# Patient Record
Sex: Male | Born: 1995 | Race: White | Hispanic: No | Marital: Single | Smoking: Never smoker
Health system: Southern US, Community
[De-identification: ages and names within clinical notes are randomized; demographics above are authoritative.]

---

## 2019-05-22 ENCOUNTER — Emergency Department (HOSPITAL_COMMUNITY): Payer: PRIVATE HEALTH INSURANCE

## 2019-05-22 ENCOUNTER — Emergency Department (HOSPITAL_COMMUNITY)
Admission: EM | Admit: 2019-05-22 | Discharge: 2019-05-23 | Disposition: A | Discharge status: Home / Self Care | Payer: PRIVATE HEALTH INSURANCE | Attending: Emergency Medicine | Admitting: Emergency Medicine

## 2019-05-22 ENCOUNTER — Encounter (HOSPITAL_COMMUNITY): Payer: Self-pay | Admitting: Family Medicine

## 2019-05-22 DIAGNOSIS — R0789 Other chest pain: Secondary | ICD-10-CM | POA: Diagnosis present

## 2019-05-22 DIAGNOSIS — Z20822 Contact with and (suspected) exposure to covid-19: Secondary | ICD-10-CM | POA: Insufficient documentation

## 2019-05-22 DIAGNOSIS — R079 Chest pain, unspecified: Secondary | ICD-10-CM

## 2019-05-22 DIAGNOSIS — R1310 Dysphagia, unspecified: Secondary | ICD-10-CM | POA: Diagnosis not present

## 2019-05-22 DIAGNOSIS — R519 Headache, unspecified: Secondary | ICD-10-CM | POA: Insufficient documentation

## 2019-05-22 LAB — BASIC METABOLIC PANEL
Anion gap: 11 (ref 5–15)
BUN: 11 mg/dL (ref 6–20)
CO2: 24 mmol/L (ref 22–32)
Calcium: 9.1 mg/dL (ref 8.9–10.3)
Chloride: 105 mmol/L (ref 98–111)
Creatinine, Ser: 0.84 mg/dL (ref 0.61–1.24)
GFR calc Af Amer: >60 mL/min (ref >60)
GFR calc non Af Amer: >60 mL/min (ref >60)
Glucose, Bld: 106 mg/dL — ABNORMAL HIGH (ref 70–99)
Potassium: 3.7 mmol/L (ref 3.5–5.1)
Sodium: 140 mmol/L (ref 135–145)

## 2019-05-22 LAB — CBC
HCT: 45.7 % (ref 39.0–52.0)
Hemoglobin: 14.4 g/dL (ref 13.0–17.0)
MCH: 27.5 pg (ref 26.0–34.0)
MCHC: 31.5 g/dL (ref 30.0–36.0)
MCV: 87.4 fL (ref 80.0–100.0)
Platelets: 314 10*3/uL (ref 150–400)
RBC: 5.23 MIL/uL (ref 4.22–5.81)
RDW: 12.9 % (ref 11.5–15.5)
WBC: 11.9 10*3/uL — ABNORMAL HIGH (ref 4.0–10.5)
nRBC: 0 % (ref 0.0–0.2)

## 2019-05-22 LAB — POC SARS CORONAVIRUS 2 AG -  ED: SARS Coronavirus 2 Ag: NEGATIVE

## 2019-05-22 LAB — TROPONIN I (HIGH SENSITIVITY): Troponin I (High Sensitivity): <2 ng/L (ref <18)

## 2019-05-22 MED ORDER — SODIUM CHLORIDE (PF) 0.9 % IJ SOLN
INTRAMUSCULAR | Status: AC
  Filled 2019-05-22: qty 50

## 2019-05-22 MED ORDER — SODIUM CHLORIDE 0.9% FLUSH: 3.0000 mL | Freq: Once | INTRAVENOUS | Status: DC

## 2019-05-22 MED ORDER — SODIUM CHLORIDE 0.9 % IV BOLUS
500.0000 mL | Freq: Once | INTRAVENOUS | Status: AC
  Administered 2019-05-22: 500 mL via INTRAVENOUS

## 2019-05-22 MED ORDER — IOHEXOL 300 MG/ML  SOLN
75.0000 mL | Freq: Once | INTRAMUSCULAR | Status: AC | PRN
  Administered 2019-05-22: 75 mL via INTRAVENOUS

## 2019-05-22 NOTE — ED Provider Notes (Addendum)
[facility]-EMERGENCY DEPT Provider Note   CSN: [id number] Arrival date & time: 05/22/19  1449     History Chief Complaint  Patient presents with  . Chest Pain  . Dysphagia  . Headache    Alexander Phillips is a 24 y.o. male otherwise healthy no daily medication use.  Patient presents today for 3 days of left-sided chest pain and difficulty swallowing.  He reports gradual onset on 05/19/2019 left-sided chest tightness which has been constant since onset moderate intensity nonradiating without aggravating or alleviating factors.  Simultaneously had feeling of "something stuck in throat" which is also been constant since onset 3 days ago, feeling of dysphagia, worsened with swallowing no alleviating factors.  He reports he was seen at urgent care prior to arrival, had Covid test and was sent to ER for further evaluation, no results from Covid test.  Associated symptoms mild nonproductive cough x3 days factors.  Denies fever/chills, headache/vision changes, neck stiffness, swelling of the face/head/neck, shortness of breath, hemoptysis, abdominal pain, nausea/vomiting, diarrhea, extremity swelling/color change, fall/injury, numbness/tingling, weakness, history of blood clot, history of cancer, recent travel/immobilization, recent surgeries, hormone use, tobacco use, family history of heart disease or any additional concerns. HPI     History reviewed. No pertinent past medical history.  There are no problems to display for this patient.   History reviewed. No pertinent surgical history.     History reviewed. No pertinent family history.  Social History   Tobacco Use  . Smoking status: Never Smoker  . Smokeless tobacco: Never Used  Substance Use Topics  . Alcohol use: Not Currently  . Drug use: Not Currently    Types: Marijuana    Home Medications Prior to Admission medications   Medication Sig Start Date End Date Taking? Authorizing Provider    pantoprazole (PROTONIX) 20 MG tablet Take 1 tablet (20 mg total) by mouth daily. 05/23/19   Bill Salinas, PA-C    Allergies    Patient has no known allergies.  Review of Systems   Review of Systems Ten systems are reviewed and are negative for acute change except as noted in the HPI  Physical Exam Updated Vital Signs BP (!) 153/91 (BP Location: Right Arm)   Pulse 82   Temp 97.9 F (36.6 C) (Oral)   Resp 15   Ht 6\' 3"  (1.905 m)   Wt 108.9 kg   SpO2 100%   BMI 30.00 kg/m   Physical Exam Constitutional:      General: He is not in acute distress.    Appearance: Normal appearance. He is well-developed. He is not ill-appearing or diaphoretic.  HENT:     Head: Normocephalic and atraumatic.     Right Ear: External ear normal.     Left Ear: External ear normal.     Nose: Nose normal.     Mouth/Throat:     Comments: The patient has normal phonation and is in control of secretions. No stridor.  Midline uvula without edema. Soft palate rises symmetrically. No tonsillar erythema, swelling or exudates. Tongue protrusion is normal, floor of mouth is soft. No trismus. No creptius on neck palpation. No gingival erythema or fluctuance noted. Mucus membranes moist. No pallor noted. Eyes:     General: Vision grossly intact. Gaze aligned appropriately.     Pupils: Pupils are equal, round, and reactive to light.  Neck:     Trachea: Trachea and phonation normal. No tracheal deviation.  Cardiovascular:     Rate and Rhythm: Normal  rate and regular rhythm.     Pulses:          Radial pulses are 2+ on the right side and 2+ on the left side.       Dorsalis pedis pulses are 2+ on the right side and 2+ on the left side.     Heart sounds: Normal heart sounds.  Pulmonary:     Effort: Pulmonary effort is normal. No accessory muscle usage or respiratory distress.     Breath sounds: Normal breath sounds.  Chest:     Chest wall: No tenderness.  Abdominal:     General: There is no distension.      Palpations: Abdomen is soft.     Tenderness: There is no abdominal tenderness. There is no guarding or rebound.  Musculoskeletal:        General: Normal range of motion.     Cervical back: Normal range of motion.     Right lower leg: No tenderness. No edema.     Left lower leg: No tenderness. No edema.  Skin:    General: Skin is warm and dry.  Neurological:     Mental Status: He is alert.     GCS: GCS eye subscore is 4. GCS verbal subscore is 5. GCS motor subscore is 6.     Comments: Speech is clear and goal oriented, follows commands Major Cranial nerves without deficit, no facial droop Moves extremities without ataxia, coordination intact  Psychiatric:        Behavior: Behavior normal.     ED Results / Procedures / Treatments   Labs (all labs ordered are listed, but only abnormal results are displayed) Labs Reviewed  BASIC METABOLIC PANEL - Abnormal; Notable for the following components:      Result Value   Glucose, Bld 106 (*)    All other components within normal limits  CBC - Abnormal; Notable for the following components:   WBC 11.9 (*)    All other components within normal limits  POC SARS CORONAVIRUS 2 AG -  ED  TROPONIN I (HIGH SENSITIVITY)  TROPONIN I (HIGH SENSITIVITY)  TROPONIN I (HIGH SENSITIVITY)    EKG EKG Interpretation  Date/Time:  Monday May 22 2019 15:27:53 EST Ventricular Rate:  98 PR Interval:    QRS Duration: 97 QT Interval:  349 QTC Calculation: 446 R Axis:   71 Text Interpretation: Sinus rhythm Nonspecific T abnormalities, diffuse leads No previous tracing Confirmed by Gilda Crease [phone]) on 05/23/2019 1:11:32 AM   Radiology DG Chest 2 View  Result Date: 05/22/2019 CLINICAL DATA:  Chest pain EXAM: CHEST - 2 VIEW COMPARISON:  None. FINDINGS: Lungs are clear. Heart size and pulmonary vascularity are normal. No adenopathy. No pneumothorax. No bone lesions. IMPRESSION: No abnormality noted. Electronically Signed   By: Bretta Bang III M.D.   On: 05/22/2019 15:42   CT Soft Tissue Neck W Contrast  Result Date: 05/23/2019 CLINICAL DATA:  Initial evaluation for dysphagia, mid neck pain for 3 days. EXAM: CT NECK WITH CONTRAST TECHNIQUE: Multidetector CT imaging of the neck was performed using the standard protocol following the bolus administration of intravenous contrast. CONTRAST:  33mL OMNIPAQUE IOHEXOL 300 MG/ML  SOLN COMPARISON:  None available. FINDINGS: Pharynx and larynx: Oral cavity within normal limits without discrete mass or collection. No acute inflammatory changes seen about the dentition. Palatine tonsils are symmetric and within normal limits. Multiple small calcified tonsilliths noted bilaterally. No tonsillar or peritonsillar abscess. Parapharyngeal fat maintained. Remainder of  the oropharynx and nasopharynx within normal limits. No retropharyngeal collection. Epiglottis normal. Vallecula clear. Remainder of the hypopharynx and supraglottic larynx within normal limits. True cords symmetric and normal. Subglottic airway clear. Salivary glands: Salivary glands including the parotid and submandibular glands are within normal limits. Thyroid: Thyroid within normal limits. Lymph nodes: No pathologically enlarged or abnormal adenopathy seen within the neck. Vascular: Normal intravascular enhancement seen throughout the neck. Limited intracranial: Unremarkable. Visualized orbits: Unremarkable. Mastoids and visualized paranasal sinuses: Visualized paranasal sinuses are clear. Visualized mastoids and middle ear cavities are well pneumatized and free of fluid. Skeleton: No acute osseous abnormality. No discrete or worrisome osseous lesions. Upper chest: Visualized upper chest demonstrates no acute finding. Partially visualized lungs are grossly clear. Other: None. IMPRESSION: Negative CT of the neck. No acute inflammatory changes or other abnormality identified. Electronically Signed   By: Rise Mu M.D.   On:  05/23/2019 00:37    Procedures Procedures (including critical care time)  Medications Ordered in ED Medications  sodium chloride flush (NS) 0.9 % injection 3 mL (0 mLs Intravenous Hold 05/22/19 2330)  sodium chloride 0.9 % bolus 500 mL (0 mLs Intravenous Stopped 05/23/19 0033)  iohexol (OMNIPAQUE) 300 MG/ML solution 75 mL (75 mLs Intravenous Contrast Given 05/22/19 2352)  sodium chloride (PF) 0.9 % injection (  Given by Other 05/23/19 0017)  pantoprazole (PROTONIX) injection 40 mg (40 mg Intravenous Given 05/23/19 0156)  alum & mag hydroxide-simeth (MAALOX/MYLANTA) 200-200-20 MG/5ML suspension 30 mL (30 mLs Oral Given 05/23/19 0158)    And  lidocaine (XYLOCAINE) 2 % viscous mouth solution 15 mL (15 mLs Oral Given 05/23/19 0158)    ED Course  I have reviewed the triage vital signs and the nursing notes.  Pertinent labs & imaging results that were available during my care of the patient were reviewed by me and considered in my medical decision making (see chart for details).  Clinical Course as of May 22 322  Tue May 23, 2019  0310 Dr. Charna Busman   [BM]    Clinical Course User Index [BM] Elizabeth Palau   MDM Rules/Calculators/A&P                      Triage work-up:  Initial high-sensitivity troponin: <2 CBC leukocytosis 11.9 otherwise within normal limits BMP glucose 106 otherwise within normal limits DG chest:  IMPRESSION:  No abnormality noted.   EKG: Sinus rhythm Nonspecific T abnormalities, diffuse leads No previous tracing Confirmed by Gilda Crease [phone]) on 05/23/2019 1:11:32 AM - On initial evaluation patient is well-appearing no acute distress 3 days of feeling of abnormal sensation in the throat and left-sided chest pain.  Cranial nerves intact, no meningeal signs, airway intact without evidence of swelling or tonsillitis, heart regular rate and rhythm without murmur, lungs clear to auscultation bilaterally, abdomen soft nontender without peritoneal signs,  neurovascular intact to all 4 extremities with strong and equal distal pulses and no evidence of DVT.  He is low risk by Wells criteria and PERC negative.  Will obtain CT soft tissue neck, delta troponin and Covid test. - Delta high-sensitivity troponin: 5 Rapid Covid negative CT soft tissue neck:  IMPRESSION:  Negative CT of the neck. No acute inflammatory changes or other  abnormality identified.   Discussed case with Dr. Blinda Leatherwood, according to normal algorithm this patient with 3 days of chest pain would only have a single troponin which was negative, reflexively attained through triage order set.  Will obtain  triple troponin.  Suspect patient symptoms today secondary to GERD, will begin Protonix and give GI cocktail. - Patient reassessed resting comfortably no acute distress updated on care plan and is agreeable. - Delta high-sensitivity troponin: 11  Patient reassessed resting comfortably no acute distress, reports improvement of symptoms following Protonix and GI cocktail. - Consult called to cardiologist Dr. Marletta Lor who advises that patient may be discharged to home at this time, with 3 troponins all within normal limits, no evidence of ACS.  Agrees that only one troponin should have been drawn on this patient.  Advises that patient may follow-up with her as outpatient if he wishes but otherwise PCP follow-up is adequate. - 3:20 AM: Patient reassessed resting comfortably no acute distress he states understanding of work-up as above and has no further questions.  He is agreeable to discharge with Protonix and outpatient follow-up.  He has no further questions or concerns.  Suspect patient symptoms secondary to GERD today, as above doubt ACS, with low risk Wells and PERC negative doubt pulmonary embolism as etiology of symptoms.  Based on history, work-up and physical examination doubt dissection as etiology patient's symptoms today.  Also patient tolerating p.o. without difficulty, doubt  Boerhaave's or other acute cardiopulmonary/thoracic or abdominal etiology patient's symptoms today.  On reevaluation he is well-appearing no acute distress and with stable vital signs.  Additionally patient is to follow-up on his outpatient Covid test and discuss results with his PCP, he has no history of fever, productive cough or sick contacts, no hypoxia or tachycardia on room air, doubt Covid or other infectious etiology patient's symptoms at this time, no indication for further testing in the ER.  At this time there does not appear to be any evidence of an acute emergency medical condition and the patient appears stable for discharge with appropriate outpatient follow up. Diagnosis was discussed with patient who verbalizes understanding of care plan and is agreeable to discharge. I have discussed return precautions with patient who verbalizes understanding of return precautions. Patient encouraged to follow-up with their PCP and Cardiology. All questions answered.  Patient's case rediscussed with Dr. Betsey Holiday who agrees with plan to discharge with follow-up.   Alexander Phillips was evaluated in Emergency Department on 05/23/2019 for the symptoms described in the history of present illness. He was evaluated in the context of the global COVID-19 pandemic, which necessitated consideration that the patient might be at risk for infection with the SARS-CoV-2 virus that causes COVID-19. Institutional protocols and algorithms that pertain to the evaluation of patients at risk for COVID-19 are in a state of rapid change based on information released by regulatory bodies including the CDC and federal and state organizations. These policies and algorithms were followed during the patient's care in the ED.   Note: Portions of this report may have been transcribed using voice recognition software. Every effort was made to ensure accuracy; however, inadvertent computerized transcription errors may still be  present. Final Clinical Impression(s) / ED Diagnoses Final diagnoses:  Nonspecific chest pain    Rx / DC Orders ED Discharge Orders         Ordered    pantoprazole (PROTONIX) 20 MG tablet  Daily     05/23/19 0314            Deliah Boston, PA-C 05/23/19 8416    Orpah Greek, MD 05/23/19 [phone]

## 2019-05-22 NOTE — ED Triage Notes (Signed)
Patient was sent from [facility] for further evaluation. Patient is complaining of headache since Friday, Jan 8th. Also, reports left sided chest pain, cough, and throat pain with difficulty swallowing for the last 3 days. Patient is alert, oriented x 3, and appears in no acute distress. Patient is able to speak in full sentences. Patient declined EMS transport and IM steroids. He did have a COVID test while there, with no results.

## 2019-05-23 LAB — TROPONIN I (HIGH SENSITIVITY)
Troponin I (High Sensitivity): 11 ng/L (ref <18)
Troponin I (High Sensitivity): 5 ng/L (ref <18)

## 2019-05-23 MED ORDER — PANTOPRAZOLE SODIUM 20 MG PO TBEC: 20.0000 mg | DELAYED_RELEASE_TABLET | Freq: Every day | ORAL | 0 refills | Status: DC

## 2019-05-23 MED ORDER — LIDOCAINE VISCOUS HCL 2 % MT SOLN
15.0000 mL | Freq: Once | OROMUCOSAL | Status: AC
  Administered 2019-05-23: 15 mL via ORAL
  Filled 2019-05-23: qty 15

## 2019-05-23 MED ORDER — ALUM & MAG HYDROXIDE-SIMETH 200-200-20 MG/5ML PO SUSP
30.0000 mL | Freq: Once | ORAL | Status: AC
  Administered 2019-05-23: 02:00:00 30 mL via ORAL
  Filled 2019-05-23: qty 30

## 2019-05-23 MED ORDER — PANTOPRAZOLE SODIUM 40 MG IV SOLR
40.0000 mg | Freq: Once | INTRAVENOUS | Status: AC
  Administered 2019-05-23: 02:00:00 40 mg via INTRAVENOUS
  Filled 2019-05-23: qty 40

## 2019-05-23 NOTE — ED Notes (Signed)
Patient ambulated to restroom without assistance. Gait steady. Will continue to monitor. 

## 2019-05-23 NOTE — ED Notes (Signed)
Patient was verbalized discharge instructions. Pt had no further questions at this time. NAD. 

## 2019-05-23 NOTE — Discharge Instructions (Signed)
You have been diagnosed today with Nonspecific Chest pain.  At this time there does not appear to be the presence of an emergent medical condition, however there is always the potential for conditions to change. Please read and follow the below instructions.  Please return to the Emergency Department immediately for any new or worsening symptoms. Please be sure to follow up with your Primary Care Provider within one week regarding your visit today; please call their office to schedule an appointment even if you are feeling better for a follow-up visit. You may take the medication Protonix as prescribed to help with your symptoms.  Please drink plenty of water and get plenty of rest. You may call the cardiologist Dr. Charna Busman on your discharge paperwork to schedule a follow-up appointment.  Get help right away if: Your chest pain is worse. You have a cough that gets worse, or you cough up blood. You have very bad (severe) pain in your belly (abdomen). You pass out (faint). You have either of these for no clear reason: Sudden chest discomfort. Sudden discomfort in your arms, back, neck, or jaw. You have shortness of breath at any time. You suddenly start to sweat, or your skin gets clammy. You feel sick to your stomach (nauseous). You throw up (vomit). You suddenly feel lightheaded or dizzy. You feel very weak or tired. Your heart starts to beat fast, or it feels like it is skipping beats. You throw up (vomit) and your throw-up looks like blood or coffee grounds. You pass out (faint). Your poop (stool) is bloody or black. You cannot swallow, drink, or eat. You have any new/concerning or worsening of symptoms  Please read the additional information packets attached to your discharge summary.  Do not take your medicine if  develop an itchy rash, swelling in your mouth or lips, or difficulty breathing; call 911 and seek immediate emergency medical attention if this occurs.  Note: Portions  of this text may have been transcribed using voice recognition software. Every effort was made to ensure accuracy; however, inadvertent computerized transcription errors may still be present.

## 2019-11-24 ENCOUNTER — Ambulatory Visit (INDEPENDENT_AMBULATORY_CARE_PROVIDER_SITE_OTHER): Payer: Worker's Compensation | Admitting: Podiatry

## 2019-11-24 ENCOUNTER — Other Ambulatory Visit: Payer: Self-pay | Admitting: Podiatry

## 2019-11-24 ENCOUNTER — Other Ambulatory Visit: Payer: Self-pay

## 2019-11-24 ENCOUNTER — Ambulatory Visit (INDEPENDENT_AMBULATORY_CARE_PROVIDER_SITE_OTHER): Payer: Worker's Compensation

## 2019-11-24 ENCOUNTER — Telehealth: Payer: Self-pay | Admitting: *Deleted

## 2019-11-24 DIAGNOSIS — M25571 Pain in right ankle and joints of right foot: Secondary | ICD-10-CM

## 2019-11-24 DIAGNOSIS — S93401A Sprain of unspecified ligament of right ankle, initial encounter: Secondary | ICD-10-CM

## 2019-11-24 DIAGNOSIS — R2681 Unsteadiness on feet: Secondary | ICD-10-CM

## 2019-11-24 DIAGNOSIS — M79671 Pain in right foot: Secondary | ICD-10-CM

## 2019-11-24 DIAGNOSIS — S93411A Sprain of calcaneofibular ligament of right ankle, initial encounter: Secondary | ICD-10-CM

## 2019-11-24 NOTE — Progress Notes (Signed)
  Subjective:  Patient ID: Alexander Phillips, male    DOB: [birth year],  MRN: [id number]  Chief Complaint  Patient presents with  . Ankle Pain    Pt states right ankle injury June 24th 2021. Pt states "I got my foot stuck in a pallete and heard a loud pop. It is swollen and painful" Pt has been wearing a boot since the injury and states urgent care told him it was a sprain. Pt states he has pain without the boot. Pain and swelling location right lateral ankle.    24 y.o. male presents with the above complaint. History confirmed with patient. States he feels popping sensation on and off with some steps even in the boot.  Objective:  Physical Exam: warm, good capillary refill, no trophic changes or ulcerative lesions, normal DP and PT pulses and normal sensory exam. Left Foot: normal exam, no swelling, tenderness, instability; ligaments intact, full range of motion of all ankle/foot joints  Right Foot: POP R ATFL, CFL. Positive Anterior drawer. Negative Talar tilt. Bruising and contusion noted.  No images are attached to the encounter.  Radiographs: X-ray of the right foot and ankle: no fracture, dislocation, swelling or degenerative changes noted Assessment:   1. Rupture of ligament of ankle, right, initial encounter   2. Sprain of calcaneofibular ligament of right ankle, initial encounter   3. Acute right ankle pain   4. Gait instability    Plan:  Patient was evaluated and treated and all questions answered.  R Ankle Severe Ankle Sprain with concern for ATFL tear -XR reviewed with patient.  -Swap out for Tall CAM boot for better immobilization -Order MRI of R ankle. R/p ATFL Tear -Ok to continue gentle PT  No follow-ups on file.

## 2019-11-24 NOTE — Telephone Encounter (Signed)
-----   Message from Park Liter, DPM sent at 11/24/2019  9:13 AM EDT ----- Can we order MRI right ankle. R/o ATFL rupture

## 2019-11-27 ENCOUNTER — Telehealth: Payer: Self-pay | Admitting: Podiatry

## 2019-11-27 NOTE — Telephone Encounter (Signed)
Left message informing pt his letter was available for pick up or he could call with a fax number.

## 2019-11-27 NOTE — Telephone Encounter (Signed)
Pt called need a letter with his restrictions for workers comp

## 2019-11-27 NOTE — Telephone Encounter (Signed)
At present patient must wear the boot at work. No prolonged standing or walking. Light or desk duty if possible.  Worst case I'm fine writing him out.

## 2019-11-28 ENCOUNTER — Telehealth: Payer: Self-pay | Admitting: Podiatry

## 2019-11-28 DIAGNOSIS — S93411A Sprain of calcaneofibular ligament of right ankle, initial encounter: Secondary | ICD-10-CM

## 2019-11-28 DIAGNOSIS — S93401A Sprain of unspecified ligament of right ankle, initial encounter: Secondary | ICD-10-CM

## 2019-11-28 DIAGNOSIS — M25571 Pain in right ankle and joints of right foot: Secondary | ICD-10-CM

## 2019-11-28 DIAGNOSIS — R2681 Unsteadiness on feet: Secondary | ICD-10-CM

## 2019-11-28 NOTE — Telephone Encounter (Signed)
Left message informing pt he will need to call [employer] [place] and begin a claim and he should discuss with his employer.

## 2019-11-28 NOTE — Telephone Encounter (Signed)
Workers Comp: [employer]    Workers Comp ER#[id number]  Phone Number: [phone]

## 2019-11-28 NOTE — Telephone Encounter (Signed)
Claim reporting to [employer] - Sander Radon states pt needs to begin Claim with them and his employer, give pt [phone].

## 2019-11-28 NOTE — Telephone Encounter (Signed)
Here is all the worker comp info I Have for Mr. Favata.  Workers Comp: [employer]    Workers Comp YB#[id number]  Phone Number: [phone]

## 2019-11-28 NOTE — Telephone Encounter (Signed)
Pt called to give claim # for workers comp claim # [id number]

## 2019-11-29 ENCOUNTER — Telehealth: Payer: Self-pay | Admitting: Podiatry

## 2019-11-29 ENCOUNTER — Encounter: Payer: Self-pay | Admitting: Podiatry

## 2019-11-29 NOTE — Telephone Encounter (Signed)
OK i'm fine with that can we write him a note for that. None of the above for at least 2 weeks

## 2019-11-29 NOTE — Telephone Encounter (Signed)
Pt called stating that workers comp  Needs Dr. Note to be more specific such as no prolonged standing , walking, driving the equipment , bending , squating  Pt stated that workers comp wanted full specifics

## 2019-11-30 NOTE — Telephone Encounter (Signed)
[employer] - [employer] states they are waiting orders for the MRI and chart notes to be faxed to [phone]. Faxed orders and clinicals to [employer] with Claim: [phone]. Faxed orders with Workers' Comp information to [employer].

## 2019-11-30 NOTE — Addendum Note (Signed)
Addended by: Alphia Kava D on: 11/30/2019 03:05 PM   Modules accepted: Orders

## 2019-12-05 ENCOUNTER — Telehealth: Payer: Self-pay | Admitting: Podiatry

## 2019-12-05 NOTE — Telephone Encounter (Signed)
I spoke with pt's wife, Marchelle Folks and she states pt can pick up the note tomorrow after PT. I informed that the MRI was actually scheduled for 12/25/2019 and she stated understanding. I asked if she wanted to schedule pt to see Dr. Samuella Cota after the MRI and she stated Workers' Comp would do that. Letter placed in front reception pick up box.

## 2019-12-05 NOTE — Telephone Encounter (Signed)
We will need to extend out of work note. Can we get updated note?

## 2019-12-05 NOTE — Telephone Encounter (Signed)
Pt spouse called and wanted to know what will they need to do about out of work note for workers comp the MRI was not scheduled till aug 12th current note has pout of work till 12/08/19 please assist

## 2019-12-06 ENCOUNTER — Telehealth: Payer: Self-pay | Admitting: *Deleted

## 2019-12-06 NOTE — Telephone Encounter (Signed)
Ginette Otto called from [employer] and wanted to know if there was another place that the patient could go to get the MRI done due to the appointment that the patient has is 12-25-2019 and the case number is [id number] and the phone number is [phone] and I called Shanda Bumps back and left a message for her to call back. Misty Stanley

## 2019-12-25 ENCOUNTER — Ambulatory Visit
Admission: RE | Admit: 2019-12-25 | Discharge: 2019-12-25 | Disposition: A | Discharge status: Home / Self Care | Payer: Worker's Compensation | Source: Ambulatory Visit | Attending: Podiatry | Admitting: Podiatry

## 2019-12-26 ENCOUNTER — Telehealth: Payer: Self-pay | Admitting: Podiatry

## 2019-12-26 NOTE — Telephone Encounter (Signed)
Pt called and was returning Dr.Price call and would like results pt is also scheduled for appt 01/02/20

## 2019-12-26 NOTE — Telephone Encounter (Signed)
Called and reviewed results

## 2020-01-02 ENCOUNTER — Other Ambulatory Visit: Payer: Self-pay

## 2020-01-02 ENCOUNTER — Encounter: Payer: Self-pay | Admitting: Podiatry

## 2020-01-02 ENCOUNTER — Ambulatory Visit (INDEPENDENT_AMBULATORY_CARE_PROVIDER_SITE_OTHER): Payer: Worker's Compensation | Admitting: Podiatry

## 2020-01-02 DIAGNOSIS — S93401D Sprain of unspecified ligament of right ankle, subsequent encounter: Secondary | ICD-10-CM | POA: Diagnosis not present

## 2020-01-02 DIAGNOSIS — M25571 Pain in right ankle and joints of right foot: Secondary | ICD-10-CM

## 2020-01-02 DIAGNOSIS — R2681 Unsteadiness on feet: Secondary | ICD-10-CM | POA: Diagnosis not present

## 2020-01-02 NOTE — Progress Notes (Signed)
  Subjective:  Patient ID: Alexander Phillips, male    DOB: 1995-08-27,  MRN: [id number]  Chief Complaint  Patient presents with  . Follow-up    R ankle injury. Pt stated, "It feels the same. Pain = 4/10. The bruising and swelling have improved".     24 y.o. male presents with the above complaint. History confirmed with patient. States the pain is still present but swelling and bruising have gotten better.  Objective:  Physical Exam: warm, good capillary refill, no trophic changes or ulcerative lesions, normal DP and PT pulses and normal sensory exam. Left Foot: normal exam, no swelling, tenderness, instability; ligaments intact, full range of motion of all ankle/foot joints  Right Foot: POP R ATFL, CFL. Positive Anterior drawer. Negative Talar tilt. Bruising and contusion noted.  No images are attached to the encounter.  MRI 12/26/19: complete rupture R ATFL Assessment:   1. Rupture of ligament of ankle, right, subsequent encounter   2. Acute right ankle pain   3. Gait instability    Plan:  Patient was evaluated and treated and all questions answered.  R Ankle Severe Ankle Sprain with concern for ATFL tear -MRI reviewed with patient. -Rupture of ATFl noted on MRI. Continued pain despite conservative care including rest, icing, aggressive PT. -Would benefit from surgical intervention  -Patient has failed all conservative therapy and wishes to proceed with surgical intervention. All risks, benefits, and alternatives discussed with patient. No guarantees given. Consent reviewed and signed by patient. -Planned procedures: Right ankle repair and supplementation of ATFL / lateral ankle ligaments.  No follow-ups on file.

## 2020-01-08 ENCOUNTER — Telehealth: Payer: Self-pay | Admitting: Podiatry

## 2020-01-08 NOTE — Telephone Encounter (Signed)
Can you help with this?

## 2020-01-08 NOTE — Telephone Encounter (Signed)
Patient called. He needs request for surgery sent to Workers Compensation. Patient states it takes 5-10 days to get approval from Workers Comp. Please advise.

## 2020-01-17 ENCOUNTER — Encounter: Payer: Self-pay | Admitting: Podiatry

## 2020-01-23 ENCOUNTER — Telehealth: Payer: Self-pay

## 2020-01-23 ENCOUNTER — Encounter: Payer: Medicaid Other | Admitting: Podiatry

## 2020-01-23 NOTE — Telephone Encounter (Signed)
DOS 01/17/2020  ANKLE STABILIZATION RT - [id number] & [phone]  SPOKE TO Latina Craver (W/C CASE WORKER [phone]) SURGERY APPROVED. REVIEW AUTH # [id number].

## 2020-01-25 ENCOUNTER — Other Ambulatory Visit: Payer: Self-pay | Admitting: Podiatry

## 2020-01-25 DIAGNOSIS — S93401D Sprain of unspecified ligament of right ankle, subsequent encounter: Secondary | ICD-10-CM | POA: Diagnosis not present

## 2020-01-25 MED ORDER — CEPHALEXIN 500 MG PO CAPS: 500.0000 mg | ORAL_CAPSULE | Freq: Two times a day (BID) | ORAL | 0 refills | Status: AC

## 2020-01-25 MED ORDER — OXYCODONE-ACETAMINOPHEN 5-325 MG PO TABS: 1.0000 | ORAL_TABLET | ORAL | 0 refills | Status: DC | PRN

## 2020-01-25 NOTE — Progress Notes (Signed)
Rx sent to pharmacy for outpatient surgery. °

## 2020-01-30 ENCOUNTER — Encounter: Payer: Medicaid Other | Admitting: Podiatry

## 2020-02-02 ENCOUNTER — Ambulatory Visit (INDEPENDENT_AMBULATORY_CARE_PROVIDER_SITE_OTHER): Payer: Worker's Compensation | Admitting: Podiatry

## 2020-02-02 ENCOUNTER — Encounter: Payer: Self-pay | Admitting: Podiatry

## 2020-02-02 ENCOUNTER — Other Ambulatory Visit: Payer: Self-pay

## 2020-02-02 DIAGNOSIS — M25571 Pain in right ankle and joints of right foot: Secondary | ICD-10-CM

## 2020-02-02 DIAGNOSIS — R2681 Unsteadiness on feet: Secondary | ICD-10-CM

## 2020-02-02 DIAGNOSIS — S93401D Sprain of unspecified ligament of right ankle, subsequent encounter: Secondary | ICD-10-CM

## 2020-02-02 MED ORDER — OXYCODONE-ACETAMINOPHEN 5-325 MG PO TABS
1.0000 | ORAL_TABLET | ORAL | 0 refills | Status: DC | PRN
Start: 2020-02-02 — End: 2020-02-19

## 2020-02-02 NOTE — Progress Notes (Signed)
  Subjective:  Patient ID: Alexander Phillips, male    DOB: May 10, 1996,  MRN: [id number]  Chief Complaint  Patient presents with  . Routine Post Op    POV#1 DOS 9.16.2021 RT ANKLE REPAIR & AUGMENTATION OF LATERAL BONE LIGAMENTS    DOS: 01/25/20 Procedure: Right ankle ATFL repair and lateral ankle ligament stabilization  24 y.o. male presents with the above complaint. History confirmed with patient. Has some pain mostly with dependency  Objective:  Physical Exam: tenderness at the surgical site, local edema noted, calf supple, nontender and bruising, contusion noted. Incision: healing well, no significant drainage, no dehiscence, no significant erythema  Assessment:   1. Rupture of ligament of ankle, right, subsequent encounter   2. Acute right ankle pain   3. Gait instability     Plan:  Patient was evaluated and treated and all questions answered.  Post-operative State -Abx ointment and telfa applied to incision. -NWB with crutches -Pain medication refilled -Short leg cast applied -F/u in 2 weeks for suture removal -Will need PT after cast removal -Will likely be able to return to work in 8 weeks  Procedure: Short Leg Cast Application  Rationale: above injury Technique: Well padded short leg cast applied with 3 layers of fiberglass casting material. Disposition: Patient tolerated procedure well. NV status intact post-application.   Return in about 2 weeks (around 02/16/2020) for Post-Op (No XRs).

## 2020-02-09 ENCOUNTER — Encounter: Payer: Medicaid Other | Admitting: Podiatry

## 2020-02-13 ENCOUNTER — Encounter: Payer: Medicaid Other | Admitting: Podiatry

## 2020-02-16 ENCOUNTER — Ambulatory Visit (INDEPENDENT_AMBULATORY_CARE_PROVIDER_SITE_OTHER): Payer: Worker's Compensation | Admitting: Podiatry

## 2020-02-16 ENCOUNTER — Other Ambulatory Visit: Payer: Self-pay

## 2020-02-16 DIAGNOSIS — S93401D Sprain of unspecified ligament of right ankle, subsequent encounter: Secondary | ICD-10-CM

## 2020-02-16 DIAGNOSIS — Z9889 Other specified postprocedural states: Secondary | ICD-10-CM

## 2020-02-16 NOTE — Progress Notes (Signed)
  Subjective:  Patient ID: Alexander Phillips, male    DOB: 01-16-1996,  MRN: [id number]  No chief complaint on file.   DOS: 01/25/20 Procedure: Right ankle ATFL repair and lateral ankle ligament stabilization  24 y.o. male presents with the above complaint. History confirmed with patient. Pain controlled denies post-op issues.  Objective:  Physical Exam: tenderness at the surgical site, local edema noted, calf supple, nontender and bruising, contusion noted. Incision: healing well, no significant drainage, no dehiscence, no significant erythema  Assessment:   1. Rupture of ligament of ankle, right, subsequent encounter   2. Post-operative state     Plan:  Patient was evaluated and treated and all questions answered.  Post-operative State -Abx ointment and telfa applied to incision. -NWB with crutches -Sutures removed. Staples left intact. -Refer to PT -Will likely be able to return to work in 8 weeks -Pt had minor syncopal event during suture removal without injury and quick reversal. No injury noted. He states he has this happen every time he gets a needle or similar procedure that involves blood.  Return in about 4 weeks (around 03/15/2020).

## 2020-02-19 ENCOUNTER — Encounter: Payer: Self-pay | Admitting: Podiatry

## 2020-02-19 ENCOUNTER — Telehealth: Payer: Self-pay | Admitting: Podiatry

## 2020-02-19 ENCOUNTER — Other Ambulatory Visit: Payer: Self-pay | Admitting: Podiatry

## 2020-02-19 MED ORDER — OXYCODONE-ACETAMINOPHEN 5-325 MG PO TABS
1.0000 | ORAL_TABLET | ORAL | 0 refills | Status: AC | PRN
Start: 2020-02-19 — End: ?

## 2020-02-19 NOTE — Telephone Encounter (Signed)
Patient called stating he needs to the physical therapy referral sent to his workers comp. Please assist with this.   Also he is going to come by the front desk in [place] to get an updated work note

## 2020-02-23 ENCOUNTER — Encounter: Payer: Medicaid Other | Admitting: Podiatry

## 2020-02-26 ENCOUNTER — Telehealth: Payer: Self-pay | Admitting: Podiatry

## 2020-02-26 NOTE — Telephone Encounter (Signed)
This patient has been trying to get his physical therapy referral sent to his workers comp for a few weeks now. Can we please get this sent ASAP?

## 2020-02-27 ENCOUNTER — Telehealth: Payer: Self-pay | Admitting: Podiatry

## 2020-02-27 NOTE — Telephone Encounter (Signed)
He received the e-mail I sent him on Friday. That's all I've done for Alexander Phillips.

## 2020-02-27 NOTE — Telephone Encounter (Signed)
Alexander Phillips,  Did you receive an email from this pt?

## 2020-02-27 NOTE — Telephone Encounter (Signed)
Pt's physical therapist would like to know if Dr. Samuella Cota would prefer to see the pt first, before they began weight bearing exercises. After speaking with Clarita Crane, I left a voice message with his physical therapist and let her know that Dr. Samuella Cota needs to see the pt first to evaluate.

## 2020-02-27 NOTE — Telephone Encounter (Signed)
I gave him Angela's e-mail and the fax number to send it back to. I want to say she got it, but I'm not 100% sure.

## 2020-03-19 ENCOUNTER — Ambulatory Visit (INDEPENDENT_AMBULATORY_CARE_PROVIDER_SITE_OTHER): Payer: Worker's Compensation | Admitting: Podiatry

## 2020-03-19 ENCOUNTER — Other Ambulatory Visit: Payer: Self-pay

## 2020-03-19 ENCOUNTER — Encounter: Payer: Self-pay | Admitting: Podiatry

## 2020-03-19 DIAGNOSIS — Z9889 Other specified postprocedural states: Secondary | ICD-10-CM

## 2020-03-19 DIAGNOSIS — S93401D Sprain of unspecified ligament of right ankle, subsequent encounter: Secondary | ICD-10-CM

## 2020-03-19 NOTE — Progress Notes (Signed)
  Subjective:  Patient ID: Alexander Phillips, male    DOB: February 16, 1996,  MRN: [id number]  Chief Complaint  Patient presents with  . Routine Post Op    POV#3 DOS 9.16.2021 Pt states physical therapy is going well and he has no concerns. Denies any pain.    DOS: 01/25/20 Procedure: Right ankle ATFL repair and lateral ankle ligament stabilization  24 y.o. male presents with the above complaint. History confirmed with patient. Doing very well states that PT is only doing ROM exercises and he is not walking yet.  Objective:  Physical Exam: mild tenderness of the ATFL. Negative anterior drawer and talar tilt. No excessive inversion laxity noted. Incision: well healed  Assessment:   1. Rupture of ligament of ankle, right, subsequent encounter   2. Post-operative state     Plan:  Patient was evaluated and treated and all questions answered.  Post-operative State -Continue PT -Start WB with PT in boot -In 2 weeks can transition to ankle brace and sneaker. -NWB with crutches until he works with PT -Staples removed. -Ankle brace dispensed.  No follow-ups on file.

## 2020-04-12 ENCOUNTER — Encounter: Payer: Self-pay | Admitting: Podiatry

## 2020-04-12 ENCOUNTER — Ambulatory Visit (INDEPENDENT_AMBULATORY_CARE_PROVIDER_SITE_OTHER): Payer: Worker's Compensation | Admitting: Podiatry

## 2020-04-12 ENCOUNTER — Other Ambulatory Visit: Payer: Self-pay

## 2020-04-12 DIAGNOSIS — S93401D Sprain of unspecified ligament of right ankle, subsequent encounter: Secondary | ICD-10-CM

## 2020-04-12 DIAGNOSIS — Z9889 Other specified postprocedural states: Secondary | ICD-10-CM

## 2020-04-12 NOTE — Progress Notes (Signed)
  Subjective:  Patient ID: Alexander Phillips, male    DOB: Jan 27, 1996,  MRN: [id number]  Chief Complaint  Patient presents with  . Routine Post Op    POV#4 Pt states pain with activity and occasional swelling. No other concerns.    DOS: 01/25/20 Procedure: Right ankle ATFL repair and lateral ankle ligament stabilization  24 y.o. male presents with the above complaint. History confirmed with patient. Doing very well only has occasional pain with activity and swelling.  Objective:  Physical Exam: no tenderness of the ATFL. Negative anterior drawer and talar tilt. No excessive inversion laxity noted. Incision: well healed  Assessment:   1. Rupture of ligament of ankle, right, subsequent encounter   2. Post-operative state     Plan:  Patient was evaluated and treated and all questions answered.  Post-operative State -He is doing well and is where I would expect him to be at this point post-surgery. He can tolerate working full days without issue. He is still wearing his brace, however. At this point he can transition out of ankle brace. Advised to slowly transition out and to still use it in the beginning for high impact activity like running and jumping. -I expect his pain to resolve over the next 2 months -I do not expect him to have a long term residual deficit from this injury. -Release to work at full duty without restrictions -Completed PT  Return in about 2 months (around 06/13/2020) for Ankle instability f/u.

## 2020-06-14 ENCOUNTER — Ambulatory Visit (INDEPENDENT_AMBULATORY_CARE_PROVIDER_SITE_OTHER): Payer: Worker's Compensation | Admitting: Podiatry

## 2020-06-14 ENCOUNTER — Encounter: Payer: Self-pay | Admitting: Podiatry

## 2020-06-14 ENCOUNTER — Other Ambulatory Visit: Payer: Self-pay

## 2020-06-14 DIAGNOSIS — S93401D Sprain of unspecified ligament of right ankle, subsequent encounter: Secondary | ICD-10-CM

## 2020-06-14 DIAGNOSIS — R2681 Unsteadiness on feet: Secondary | ICD-10-CM | POA: Diagnosis not present

## 2020-06-14 NOTE — Progress Notes (Signed)
  Subjective:  Patient ID: Alexander Phillips, male    DOB: 1995-05-29,  MRN: [id number]  Chief Complaint  Patient presents with  . Ankle Instability    Pt states no concerns and healing well.    DOS: 01/25/20 Procedure: Right ankle ATFL repair and lateral ankle ligament stabilization  25 y.o. male presents with the above complaint. History confirmed with patient. Doing very well denies pain, issues, swelling, popping sensations. Pleased with results. Is back at work without issues.  Objective:  Physical Exam: no tenderness of the ATFL. Negative anterior drawer and talar tilt. No excessive inversion laxity noted. Incision: well healed  Assessment:   1. Rupture of ligament of ankle, right, subsequent encounter   2. Gait instability     Plan:  Patient was evaluated and treated and all questions answered.  Post-operative State -At this point he is to be fully released from care. He appears to have made full recovery. He does not complain about residual issues or deficits. He is pleased with results.  Return if symptoms worsen or fail to improve.

## 2021-08-25 IMAGING — CR DG CHEST 2V
2 series · 2 of 2 positions shown · non-contrast
Comparison: None.

CLINICAL DATA: Chest pain

EXAM:
CHEST - 2 VIEW

[w chest pa]
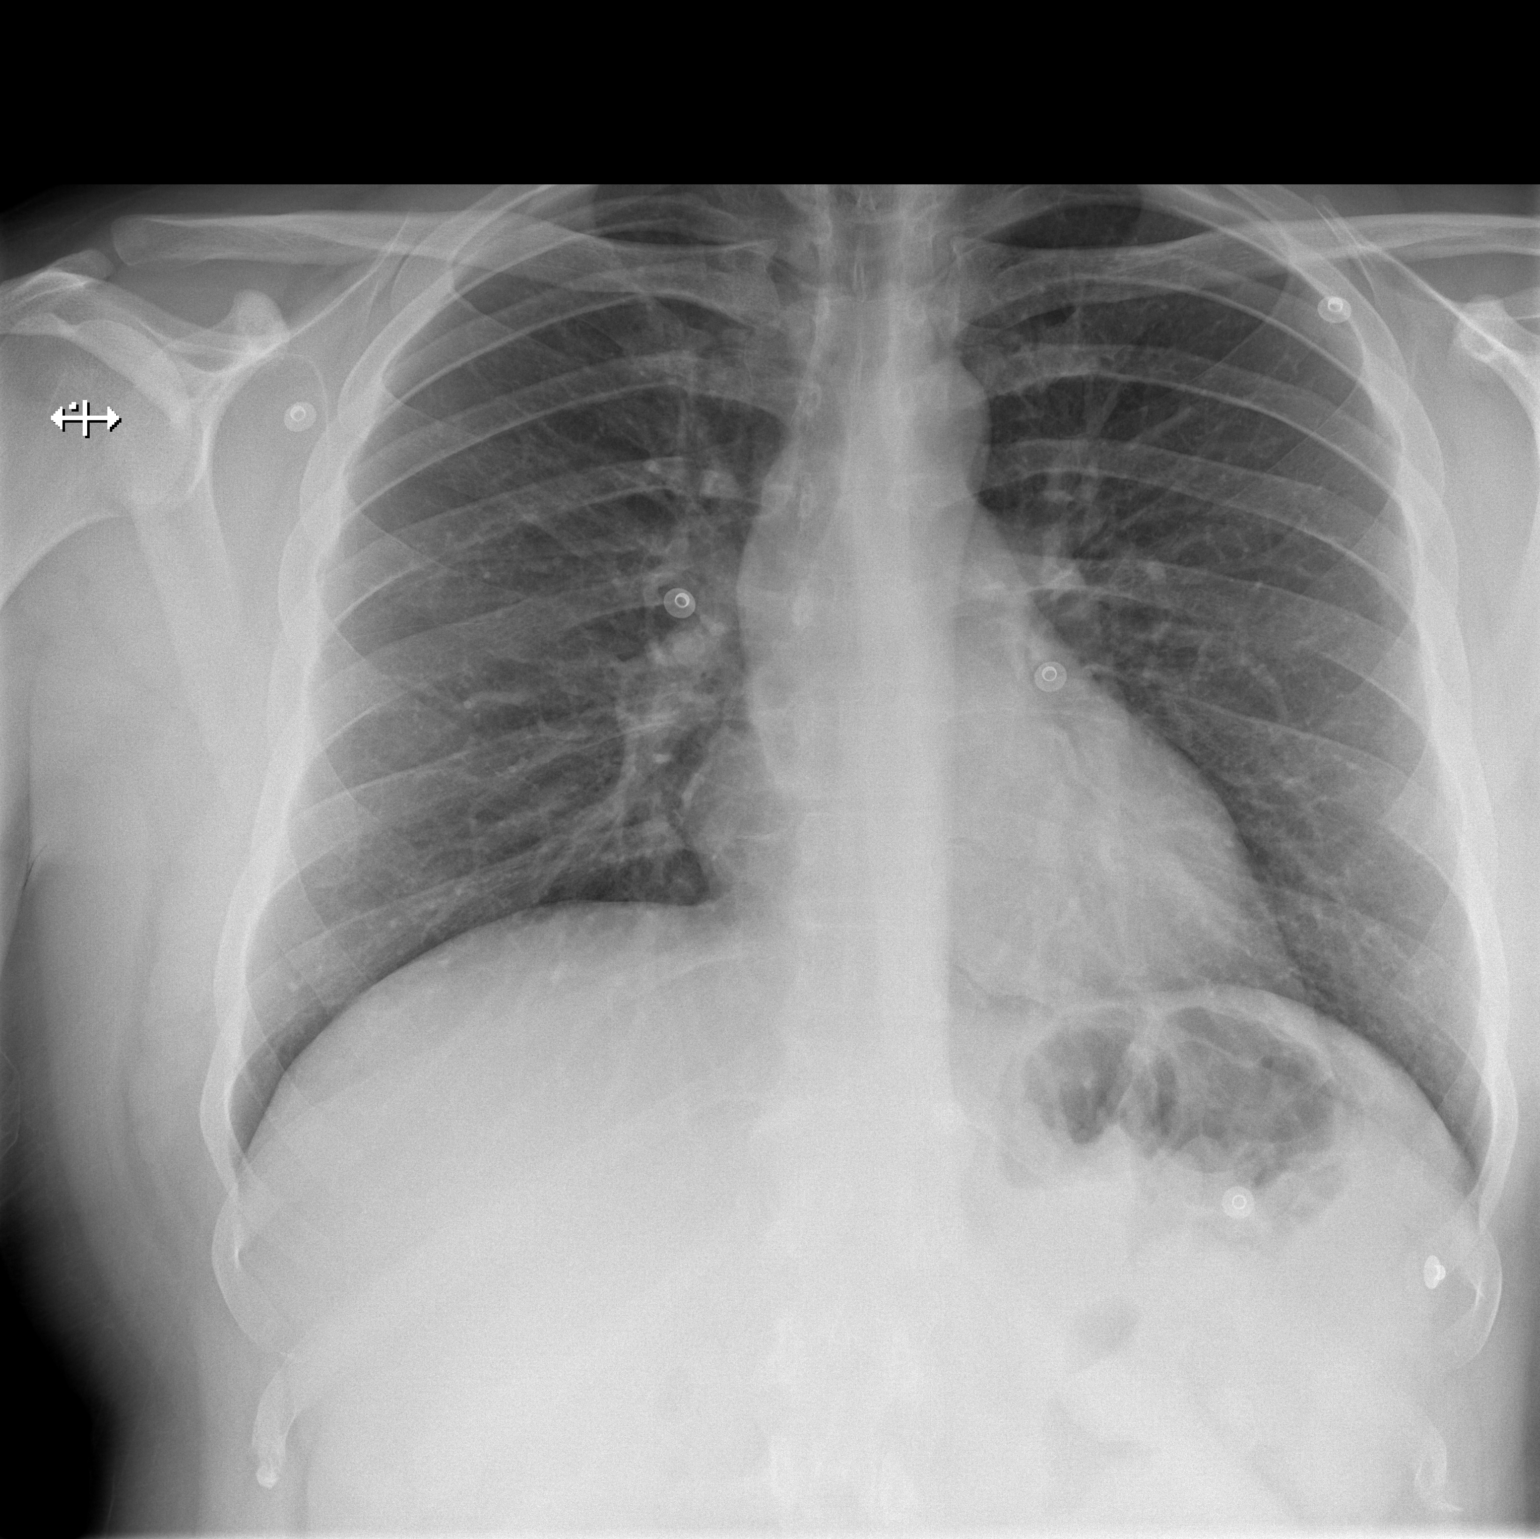

[w chest lat]
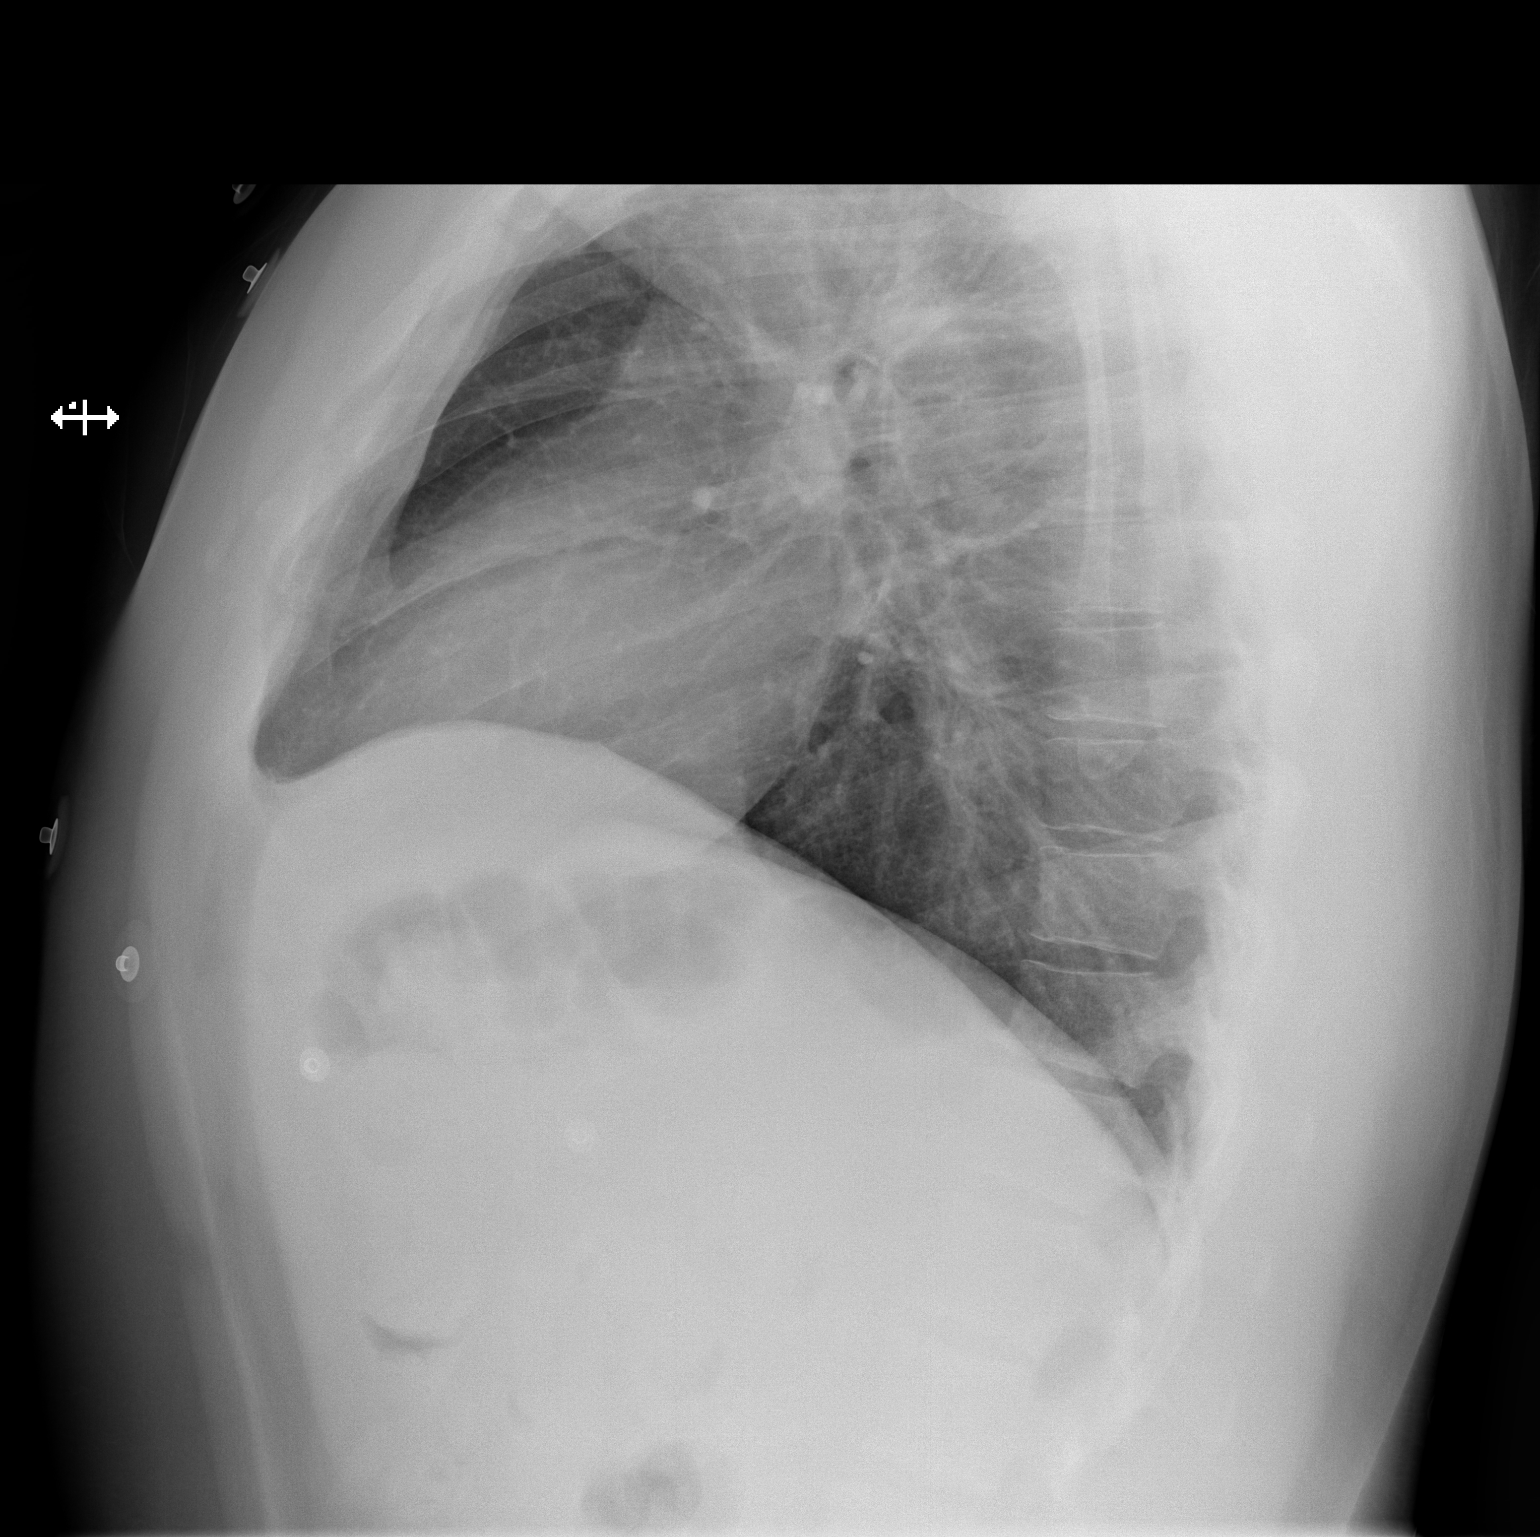

[2 of 2 positions shown; findings below may reference images not displayed]

FINDINGS: Lungs are clear. Heart size and pulmonary vascularity are normal. No
adenopathy. No pneumothorax. No bone lesions.
IMPRESSION: No abnormality noted.
